# Patient Record
Sex: Female | Born: 1995 | Race: Black or African American | Hispanic: No | Marital: Single | Smoking: Never smoker
Health system: Southern US, Community
[De-identification: ages and names within clinical notes are randomized; demographics above are authoritative.]

---
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
## 2016-08-18 ENCOUNTER — Emergency Department (HOSPITAL_COMMUNITY)
Admission: EM | Admit: 2016-08-18 | Discharge: 2016-08-19 | Disposition: A | Discharge status: Home / Self Care | Payer: Self-pay | Attending: Emergency Medicine | Admitting: Emergency Medicine

## 2016-08-18 ENCOUNTER — Encounter (HOSPITAL_COMMUNITY): Payer: Self-pay | Admitting: Emergency Medicine

## 2016-08-18 DIAGNOSIS — R0789 Other chest pain: Secondary | ICD-10-CM | POA: Insufficient documentation

## 2016-08-18 NOTE — ED Provider Notes (Signed)
[facility]EMER[facility]Y DEPT Provider Note   CSN: [id number] Arrival date & time: 08/18/16  2233  By signing my name below, I, Majel HomerPeyton Lee, attest that this documentation has been prepared under the direction and in the presence of non-physician practitioner, Trixie DredgeEmily Gwendelyn Lanting, PA-C. Electronically Signed: Majel HomerPeyton Lee, Scribe. 08/18/2016. 11:56 PM.  History   Chief Complaint Chief Complaint  Patient presents with  . Shoulder Pain   The history is provided by the patient. No language interpreter was used.   HPI Comments: Brandy Porter is a 20 y.o. female who presents to the Emergency Department complaining of sudden onset, constant, left sided rib, chest and upper abdominal pain that began at ~7:00 PM this evening. Pt reports she was walking around the grocery store at the onset of her pain. She notes her pain is exacerbated when turning her body to the left; she states it is difficult to breathe during movement. Pt states associated weakness throughout her left arm that began at the same time as her rib pain. She denies recent fall or injury and any recent changes in activity. Pt also states she has a headache that began 30 minutes ago while in the ED. She denies fever, chills, sore throat, cough, nausea, vomiting, diarrhea, urinary frequency, dysuria, bowel incontinence, recent travel, leg swelling, vaginal bleeding and vaginal discharge. Pt reports her last normal menstrual period was 3 weeks ago. She denies hx and FHx of medical problems and blood clots.   Denies recent immobilization, exogenous estrogen, leg swelling, history of blood clots.     History reviewed. No pertinent past medical history.  There are no active problems to display for this patient.  History reviewed. No pertinent surgical history.  OB History    No data available     Home Medications    Prior to Admission medications   Medication Sig Start Date End Date Taking? Authorizing Provider  ibuprofen (ADVIL,MOTRIN) 600 MG tablet  Take 1 tablet (600 mg total) by mouth every 6 (six) hours as needed for mild pain or moderate pain. 08/19/16   Trixie DredgeEmily Kandra Graven, PA-C   Family History History reviewed. No pertinent family history.  Social History Social History  Substance Use Topics  . Smoking status: Never Smoker  . Smokeless tobacco: Never Used  . Alcohol use No   Allergies   Review of patient's allergies indicates no known allergies.  Review of Systems Review of Systems  Constitutional: Negative for chills and fever.  HENT: Negative for sore throat.   Respiratory: Positive for shortness of breath. Negative for cough.   Cardiovascular: Positive for chest pain. Negative for leg swelling.  Gastrointestinal: Positive for abdominal pain. Negative for diarrhea, nausea and vomiting.  Genitourinary: Negative for difficulty urinating, dysuria, vaginal bleeding and vaginal discharge.  Musculoskeletal: Positive for arthralgias and myalgias.  Neurological: Positive for weakness and headaches.   Physical Exam Updated Vital Signs BP 121/85 (BP Location: Left Arm)   Pulse 100   Temp 98.2 F (36.8 C) (Oral)   Resp 12   Ht 5\' 2"  (1.575 m)   Wt 127 lb 8 oz (57.8 kg)   LMP 08/04/2016 (Approximate)   SpO2 99%   BMI 23.32 kg/m   Physical Exam  Constitutional: She appears well-developed and well-nourished. No distress.  HENT:  Head: Normocephalic and atraumatic.  Neck: Normal range of motion. Neck supple.  Cardiovascular: Normal rate and regular rhythm.   Pulmonary/Chest: Effort normal and breath sounds normal. No respiratory distress. She has no wheezes. She has no rales.  She exhibits tenderness.  Abdominal: Soft. She exhibits no distension. There is no tenderness. There is no rebound and no guarding.  Musculoskeletal:  Upper extremities:  Strength 5/5 throughout, sensation intact, distal pulses intact.   Pain in left upper arm with shoulder abduction and posterior motion against resistance.  No cervical spine tenderness.      Neurological: She is alert.  No pronator drift   Skin: No rash noted. She is not diaphoretic. No erythema.  No skin changes of left lateral chest wall or abdomen  Nursing note and vitals reviewed.  ED Treatments / Results  Labs (all labs ordered are listed, but only abnormal results are displayed) Labs Reviewed - No data to display  EKG  EKG Interpretation None       ED ECG REPORT   Date: 08/19/2016  Rate: 93  Rhythm: normal sinus rhythm  QRS Axis: normal  Intervals: QT prolonged  ST/T Wave abnormalities: nonspecific T wave changes  Conduction Disutrbances:none  Narrative Interpretation:   Old EKG Reviewed: none available  I have personally reviewed the EKG tracing and agree with the computerized printout as noted. Radiology Dg Ribs Unilateral W/chest Left  Result Date: 08/19/2016 CLINICAL DATA:  General left-sided rib pain for 2 hours. Pain with inspiration. EXAM: LEFT RIBS AND CHEST - 3+ VIEW COMPARISON:  None. FINDINGS: No fracture or other bone lesions are seen involving the ribs. There is no pneumothorax or pleural effusion. Both lungs are clear. Heart size and mediastinal contours are within normal limits. IMPRESSION: No displaced rib fracture. Electronically Signed   By: Deatra RobinsonKevin  Herman M.D.   On: 08/19/2016 00:54    Procedures Procedures  DIAGNOSTIC STUDIES:  Oxygen Saturation is 99% on RA, normal by my interpretation.    COORDINATION OF CARE:  11:54 PM Discussed treatment plan with pt at bedside and pt agreed to plan.  Medications Ordered in ED Medications  ibuprofen (ADVIL,MOTRIN) tablet 600 mg (not administered)    Initial Impression / Assessment and Plan / ED Course  I have reviewed the triage vital signs and the nursing notes.  Pertinent labs & imaging results that were available during my care of the patient were reviewed by me and considered in my medical decision making (see chart for details).  Clinical Course  Comment By Time  Patient  currently without pain.  States she only has pain when she moves.   Trixie Dredgemily Crystian Frith, PA-C 08/21 0106    Afebrile, nontoxic patient with left lateral chest wall and left lateral abdominal wall pain and reported left arm weakness.  Denies injury.  There is no objective weakness on exam.  Pain is atypical - occurs only with movement/change positions, twisting to the left.  Exam is remarkable only for tenderness over left lateral chest wall, directly adjacent to the area of pain in the left arm with testing against resistance.  She has no risk factors for PE.  No cardiac risk factors.  No sick symptoms.  XR negative. EKG with slightly prolonged QT, nonspecific T wave changes.   Doubt ACS, PE, pneumonia.  C D/C home with motrin, PCP follow up.  Discussed result, findings, treatment, and follow up  with patient.  Pt given return precautions.  Pt verbalizes understanding and agrees with plan.       I personally performed the services described in this documentation, which was scribed in my presence. The recorded information has been reviewed and is accurate.   Final Clinical Impressions(s) / ED Diagnoses   Final diagnoses:  Chest wall pain    New Prescriptions New Prescriptions   IBUPROFEN (ADVIL,MOTRIN) 600 MG TABLET    Take 1 tablet (600 mg total) by mouth every 6 (six) hours as needed for mild pain or moderate pain.     Trixie Dredge, PA-C 08/19/16 0109    Shon Baton, MD 08/19/16 [phone]

## 2016-08-18 NOTE — ED Notes (Signed)
C/o pain in left rib cage that started a few hours ago.  Denies any injury.  Also reports left arm being weak.

## 2016-08-19 ENCOUNTER — Emergency Department (HOSPITAL_COMMUNITY): Payer: Self-pay

## 2016-08-19 ENCOUNTER — Other Ambulatory Visit: Payer: Self-pay

## 2016-08-19 MED ORDER — IBUPROFEN 600 MG PO TABS: 600.0000 mg | ORAL_TABLET | Freq: Four times a day (QID) | ORAL | 0 refills | Status: DC | PRN

## 2016-08-19 MED ORDER — IBUPROFEN 400 MG PO TABS
600.0000 mg | ORAL_TABLET | Freq: Once | ORAL | Status: AC
  Administered 2016-08-19: 600 mg via ORAL
  Filled 2016-08-19: qty 1

## 2016-08-19 NOTE — Discharge Instructions (Signed)
Read the information below.  Use the prescribed medication as directed.  Please discuss all new medications with your pharmacist.  You may return to the Emergency Department at any time for worsening condition or any new symptoms that concern you.   ° °You have been diagnosed by your caregiver as having chest wall pain. °SEEK IMMEDIATE MEDICAL ATTENTION IF: °You develop a fever.  °Your chest pains become severe or intolerable.  °You develop new, unexplained symptoms (problems).  °You develop shortness of breath, nausea, vomiting, sweating or feel light headed.  °You develop a new cough or you cough up blood. °

## 2016-08-19 NOTE — ED Notes (Signed)
Patient arrives with complaint of lateral thorax pain and left shoulder pain. Denies injury. States onset today while at grocery store. Raising arm anteriorly does not exacerbate pain, but posterior movement of arm exacerbates pain. ROM intact. Denies other complaints.

## 2017-06-24 ENCOUNTER — Emergency Department (HOSPITAL_COMMUNITY)
Admission: EM | Admit: 2017-06-24 | Discharge: 2017-06-24 | Disposition: A | Discharge status: Home / Self Care | Payer: Self-pay | Attending: Emergency Medicine | Admitting: Emergency Medicine

## 2017-06-24 ENCOUNTER — Encounter (HOSPITAL_COMMUNITY): Payer: Self-pay

## 2017-06-24 DIAGNOSIS — R300 Dysuria: Secondary | ICD-10-CM | POA: Insufficient documentation

## 2017-06-24 LAB — CBC
HCT: 39.2 % (ref 36.0–46.0)
Hemoglobin: 13.4 g/dL (ref 12.0–15.0)
MCH: 27.1 pg (ref 26.0–34.0)
MCHC: 34.2 g/dL (ref 30.0–36.0)
MCV: 79.4 fL (ref 78.0–100.0)
PLATELETS: 289 10*3/uL (ref 150–400)
RBC: 4.94 MIL/uL (ref 3.87–5.11)
RDW: 13.6 % (ref 11.5–15.5)
WBC: 7.9 10*3/uL (ref 4.0–10.5)

## 2017-06-24 LAB — COMPREHENSIVE METABOLIC PANEL
ALT: 14 U/L (ref 14–54)
AST: 21 U/L (ref 15–41)
Albumin: 4 g/dL (ref 3.5–5.0)
Alkaline Phosphatase: 73 U/L (ref 38–126)
Anion gap: 7 (ref 5–15)
BILIRUBIN TOTAL: 0.6 mg/dL (ref 0.3–1.2)
BUN: 8 mg/dL (ref 6–20)
CO2: 23 mmol/L (ref 22–32)
CREATININE: 0.65 mg/dL (ref 0.44–1.00)
Calcium: 8.9 mg/dL (ref 8.9–10.3)
Chloride: 106 mmol/L (ref 101–111)
GFR calc Af Amer: >60 mL/min (ref >60)
Glucose, Bld: 104 mg/dL — ABNORMAL HIGH (ref 65–99)
Potassium: 4.1 mmol/L (ref 3.5–5.1)
Sodium: 136 mmol/L (ref 135–145)
TOTAL PROTEIN: 7 g/dL (ref 6.5–8.1)

## 2017-06-24 LAB — URINALYSIS, ROUTINE W REFLEX MICROSCOPIC
Bilirubin Urine: NEGATIVE
GLUCOSE, UA: NEGATIVE mg/dL
Hgb urine dipstick: NEGATIVE
KETONES UR: NEGATIVE mg/dL
NITRITE: NEGATIVE
PH: 5 (ref 5.0–8.0)
Protein, ur: 30 mg/dL — AB
SPECIFIC GRAVITY, URINE: 1.014 (ref 1.005–1.030)

## 2017-06-24 LAB — I-STAT BETA HCG BLOOD, ED (MC, WL, AP ONLY): I-stat hCG, quantitative: <5 m[IU]/mL (ref <5)

## 2017-06-24 MED ORDER — CEPHALEXIN 500 MG PO CAPS: 500.0000 mg | ORAL_CAPSULE | Freq: Two times a day (BID) | ORAL | 0 refills | Status: DC

## 2017-06-24 NOTE — ED Triage Notes (Signed)
Pt states pain in the lower part of her abdomen with movement or with urination. Pt denies pain at this time. Pt denies n/v.

## 2017-06-24 NOTE — ED Provider Notes (Signed)
[facility] Provider Note   CSN: [id number] Arrival date & time: 06/24/17  1217  By signing my name below, I, Sonum Patel, attest that this documentation has been prepared under the direction and in the presence of Wells FargoKelly Rickey Sadowski, PA-C. Electronically Signed: Leone PayorSonum Patel, Scribe. 06/24/17. 2:14 PM.  History   Chief Complaint Chief Complaint  Patient presents with  . Abdominal Pain    The history is provided by the patient. No language interpreter was used.     HPI Comments: Brandy Porter is a 21 y.o. female who presents to the Emergency Department complaining of dysuria with associated lower abdominal pain and frequency that began last night. She denies similar symptoms in the past. She denies nausea, vomiting, pain with BM, fever, flank pain, vaginal bleeding, vaginal discharge. LNMP was May 20th. She has never been sexually active and does not use birth control.   History reviewed. No pertinent past medical history.  There are no active problems to display for this patient.   History reviewed. No pertinent surgical history.  OB History    No data available       Home Medications    Prior to Admission medications   Medication Sig Start Date End Date Taking? Authorizing Provider  ibuprofen (ADVIL,MOTRIN) 600 MG tablet Take 1 tablet (600 mg total) by mouth every 6 (six) hours as needed for mild pain or moderate pain. 08/19/16   Trixie DredgeWest, Emily, PA-C    Family History History reviewed. No pertinent family history.  Social History Social History  Substance Use Topics  . Smoking status: Never Smoker  . Smokeless tobacco: Never Used  . Alcohol use No     Allergies   Patient has no known allergies.   Review of Systems Review of Systems  Constitutional: Negative for fever.  Gastrointestinal: Positive for abdominal pain. Negative for nausea and vomiting.  Genitourinary: Positive for dysuria and frequency. Negative for flank pain, hematuria, urgency, vaginal  bleeding and vaginal discharge.  All other systems reviewed and are negative.    Physical Exam Updated Vital Signs BP 117/71 (BP Location: Left Arm)   Pulse 88   Temp 97.8 F (36.6 C) (Oral)   Resp 16   LMP 05/18/2017 (Within Days)   SpO2 100%   Physical Exam  Constitutional: She is oriented to person, place, and time. She appears well-developed and well-nourished. No distress.  HENT:  Head: Normocephalic and atraumatic.  Eyes: Conjunctivae are normal. Pupils are equal, round, and reactive to light. Right eye exhibits no discharge. Left eye exhibits no discharge. No scleral icterus.  Neck: Normal range of motion.  Cardiovascular: Normal rate and regular rhythm.  Exam reveals no gallop and no friction rub.   No murmur heard. Pulmonary/Chest: Effort normal and breath sounds normal. No respiratory distress. She has no wheezes. She has no rales. She exhibits no tenderness.  Abdominal: Soft. Bowel sounds are normal. She exhibits no distension and no mass. There is no tenderness. There is no rebound and no guarding. No hernia.  No CVA tenderness  Neurological: She is alert and oriented to person, place, and time.  Skin: Skin is warm and dry.  Psychiatric: She has a normal mood and affect. Her behavior is normal.  Nursing note and vitals reviewed.    ED Treatments / Results  DIAGNOSTIC STUDIES: Oxygen Saturation is 100% on RA, normal by my interpretation.    COORDINATION OF CARE: 2:14 PM Discussed treatment plan with pt at bedside and pt agreed to plan.  Labs (all labs ordered are listed, but only abnormal results are displayed) Labs Reviewed  COMPREHENSIVE METABOLIC PANEL - Abnormal; Notable for the following:       Result Value   Glucose, Bld 104 (*)    All other components within normal limits  URINALYSIS, ROUTINE W REFLEX MICROSCOPIC - Abnormal; Notable for the following:    APPearance HAZY (*)    Protein, ur 30 (*)    Leukocytes, UA TRACE (*)    Bacteria, UA RARE (*)     Squamous Epithelial / LPF 0-5 (*)    All other components within normal limits  URINE CULTURE  CBC  I-STAT BETA HCG BLOOD, ED ([facility], [facility], [facility] ONLY)    EKG  EKG Interpretation None       Radiology No results found.  Procedures Procedures (including critical care time)  Medications Ordered in ED Medications - No data to display   Initial Impression / Assessment and Plan / ED Course  I have reviewed the triage vital signs and the nursing notes.  Pertinent labs & imaging results that were available during my care of the patient were reviewed by me and considered in my medical decision making (see chart for details).  21 year old female presents with dysuria and frequency. Vitals are normal. Labs are overall unremarkable. UA does not show obvious UTI but will treat since she is symptomatic and send culture. Return precautions given.  Final Clinical Impressions(s) / ED Diagnoses   Final diagnoses:  Dysuria    New Prescriptions New Prescriptions   No medications on file   I personally performed the services described in this documentation, which was scribed in my presence. The recorded information has been reviewed and is accurate.    Bethel Born, PA-C 06/24/17 1541    Alvira Monday, MD 06/25/17 [phone]

## 2017-06-24 NOTE — Discharge Instructions (Signed)
Take antibiotic twice a day for 5 days Return for worsening symptoms

## 2017-06-25 LAB — URINE CULTURE

## 2017-07-23 IMAGING — DX DG RIBS W/ CHEST 3+V*L*
3 series · 3 of 3 positions shown · non-contrast
Comparison: None.

CLINICAL DATA: General left-sided rib pain for 2 hours. Pain with
inspiration.

EXAM:
LEFT RIBS AND CHEST - 3+ VIEW

[chest pa]
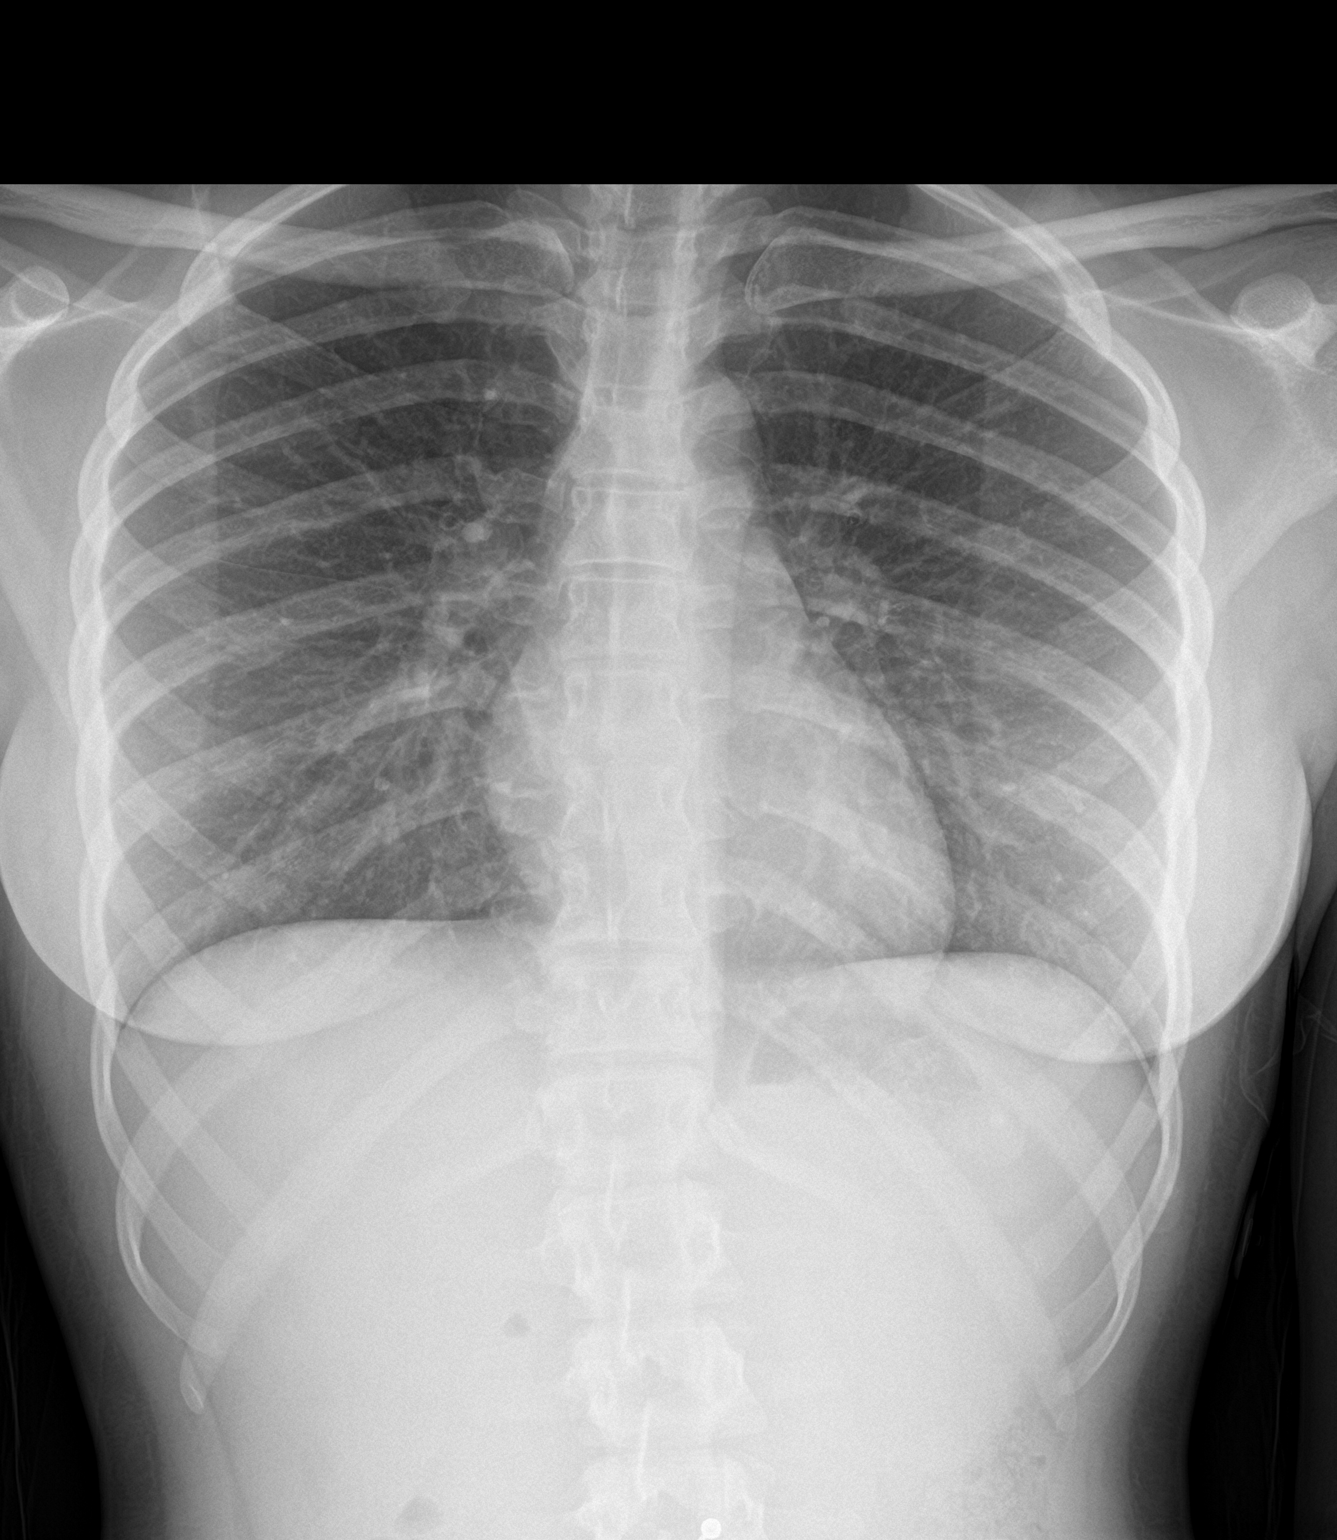

[rib pa obl]
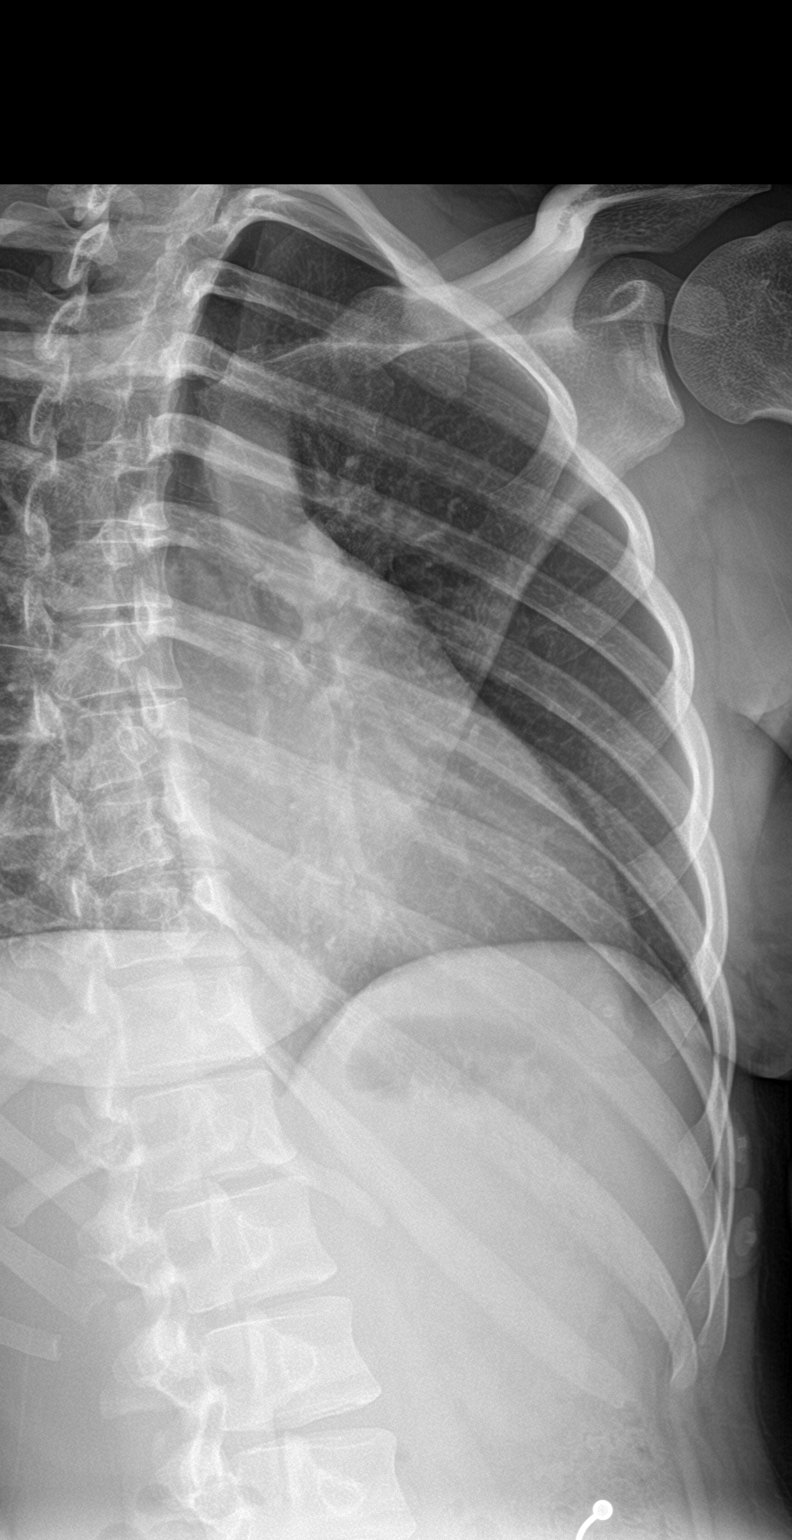

[rib pa]
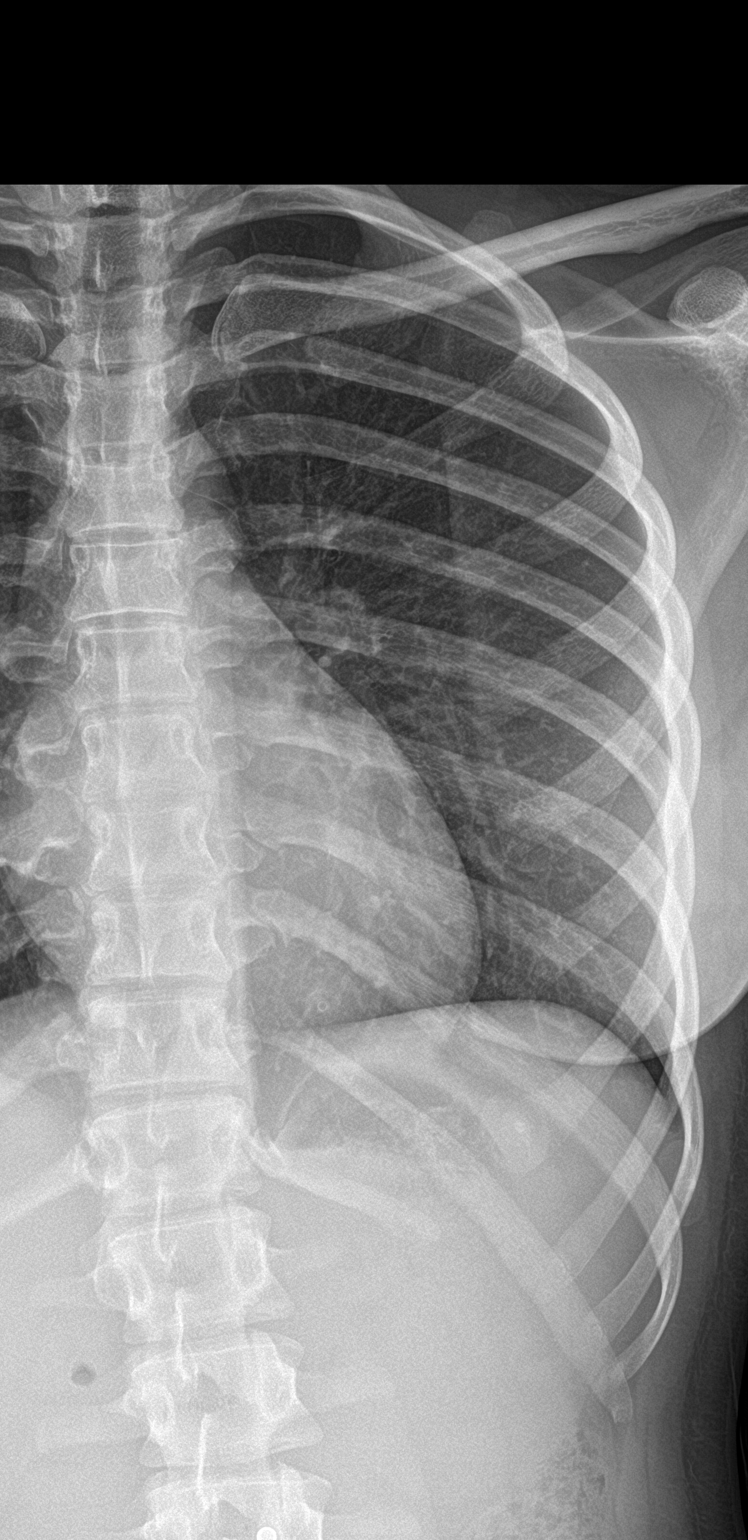

[3 of 3 positions shown; findings below may reference images not displayed]

FINDINGS: No fracture or other bone lesions are seen involving the ribs. There
is no pneumothorax or pleural effusion. Both lungs are clear. Heart
size and mediastinal contours are within normal limits.
IMPRESSION: No displaced rib fracture.

## 2020-03-28 ENCOUNTER — Ambulatory Visit: Payer: Self-pay | Admitting: Family Medicine

## 2020-03-28 ENCOUNTER — Encounter: Payer: Self-pay | Admitting: Family Medicine

## 2020-03-28 VITALS — BP 118/76 | HR 82 | Wt 189.0 lb

## 2020-03-28 DIAGNOSIS — Z789 Other specified health status: Secondary | ICD-10-CM

## 2020-03-28 NOTE — Progress Notes (Signed)
  Subjective:     Patient ID: Brandy Porter, female   DOB: 02-Jul-1996, 24 y.o.   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 She does not currently have a PCP. She has never had a pap smear. She is sexually active but not currently, uses condoms. No other form of birth control. Periods are regular. She has not received flu or covid-19 vaccines. She is a non-smoker. States she needs to work on increasing her exercise. She denies any current problems or concerns.   No past medical history on file. No Known Allergies No current outpatient medications on file.    Review of Systems  Constitutional: Negative for chills, fatigue, fever and unexpected weight change.  HENT: Negative for congestion, ear pain, sinus pressure, sinus pain and sore throat.   Eyes: Negative for discharge and visual disturbance.  Respiratory: Negative for cough, shortness of breath and wheezing.   Cardiovascular: Negative for chest pain and leg swelling.  Gastrointestinal: Negative for abdominal pain, blood in stool, constipation, diarrhea, nausea and vomiting.  Genitourinary: Negative for difficulty urinating and hematuria.  Skin: Negative for color change.  Neurological: Negative for dizziness, weakness, light-headedness and headaches.  Hematological: Negative for adenopathy.  All other systems reviewed and are negative.      Objective:   Physical Exam Vitals reviewed.  Constitutional:      General: She is not in acute distress.    Appearance: Normal appearance. She is well-developed.  HENT:     Head: Normocephalic and atraumatic.  Eyes:     General:        Right eye: No discharge.        Left eye: No discharge.  Cardiovascular:     Rate and Rhythm: Normal rate and regular rhythm.     Heart sounds: Normal heart sounds.  Pulmonary:     Effort: Pulmonary effort is normal. No respiratory distress.     Breath sounds: Normal  breath sounds.  Musculoskeletal:     Cervical back: Neck supple.  Skin:    General: Skin is warm and dry.  Neurological:     Mental Status: She is alert and oriented to person, place, and time.  Psychiatric:        Mood and Affect: Mood normal.        Behavior: Behavior normal.    Today's Vitals   03/28/20 1435  BP: 118/76  Pulse: 82  SpO2: 98%  Weight: 189 lb (85.7 kg)   Body mass index is 34.57 kg/m.      Assessment:     Participant in health and wellness plan      Plan:     1. Encouraged establishment with PCP and to have pap smear done. Discussed HPV vaccination if she has not already received it, she was unsure of her vaccination status. Information given on [facility] physician referral service phone number to help with getting access to PCP.  2. Encouraged healthy eating and increasing physical activity. 3. F/u here prn.

## 2021-09-11 ENCOUNTER — Ambulatory Visit: Payer: Self-pay | Admitting: Family Medicine

## 2021-09-11 ENCOUNTER — Encounter: Payer: Self-pay | Admitting: Family Medicine

## 2021-09-11 VITALS — BP 102/64 | HR 85

## 2021-09-11 DIAGNOSIS — Z789 Other specified health status: Secondary | ICD-10-CM

## 2021-09-11 NOTE — Progress Notes (Signed)
  Subjective:     Patient ID: Brandy Porter, female   DOB: July 18, 1996, 25 y.o.   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 She has an appt tomorrow to establish care with a provider at [facility], where she will have a pap smear completed. She reports doing well with healthy eating and exercise. She denies any problems or concerns at today's visit.   No past medical history on file. No Known Allergies No current outpatient medications on file.   Review of Systems  Constitutional:  Negative for chills, fatigue, fever and unexpected weight change.  HENT:  Negative for congestion, ear pain, sinus pressure, sinus pain and sore throat.   Eyes:  Negative for discharge and visual disturbance.  Respiratory:  Negative for cough, shortness of breath and wheezing.   Cardiovascular:  Negative for chest pain and leg swelling.  Gastrointestinal:  Negative for abdominal pain, blood in stool, constipation, diarrhea, nausea and vomiting.  Genitourinary:  Negative for difficulty urinating and hematuria.  Skin:  Negative for color change.  Neurological:  Negative for dizziness, weakness, light-headedness and headaches.  Hematological:  Negative for adenopathy.  All other systems reviewed and are negative.     Objective:   Physical Exam Vitals reviewed.  Constitutional:      General: She is not in acute distress.    Appearance: Normal appearance. She is well-developed.  HENT:     Head: Normocephalic and atraumatic.  Eyes:     General:        Right eye: No discharge.        Left eye: No discharge.  Cardiovascular:     Rate and Rhythm: Normal rate and regular rhythm.     Heart sounds: Normal heart sounds.  Pulmonary:     Effort: Pulmonary effort is normal. No respiratory distress.     Breath sounds: Normal breath sounds.  Musculoskeletal:     Cervical back: Neck supple.  Skin:    General: Skin is warm and  dry.  Neurological:     Mental Status: She is alert and oriented to person, place, and time.  Psychiatric:        Mood and Affect: Mood normal.        Behavior: Behavior normal.   Today's Vitals   09/11/21 0943  BP: 102/64  Pulse: 85  SpO2: 99%   There is no height or weight on file to calculate BMI.     Assessment:     Participant in health and wellness plan     Plan:     Keep all regular appts with PCP. Continue efforts at healthy eating and exercise. F/u here prn.

## 2022-08-13 ENCOUNTER — Encounter: Payer: Self-pay | Admitting: Nurse Practitioner

## 2022-08-13 ENCOUNTER — Ambulatory Visit: Payer: Self-pay | Admitting: Nurse Practitioner

## 2022-08-13 VITALS — BP 118/80 | HR 86

## 2022-08-13 DIAGNOSIS — Z789 Other specified health status: Secondary | ICD-10-CM

## 2022-08-13 NOTE — Progress Notes (Signed)
Office Visit.   Subjective:  Patient ID: Brandy Porter, female    DOB: 31-Oct-1996  Age: 26 y.o. MRN: [id number]  CC: wellness exam  HPI Brandy Porter presents for wellness exam visit for insurance benefit.  Patient has a PCP: [facility], Dr. Lorane Gell.  PMH significant for: No significant PMH. Last labs per PCP were completed: September 2022  Health Maintenance:  Pap: September 2022 Colonoscopy: No family history.  Mammogram: no significant family history.     Smoker: never Occasional alcohol use (social drinker)  Immunizations:  COVID- x 2, declines booster Does not usually get flu vaccine. Tdap- unsure of last dose. She will defer for now.   Lifestyle: Diet- tries to avoid snacking.  Exercise- sometimes. Not regularly.    No past medical history on file.  No past surgical history on file.  No outpatient medications prior to visit.   No facility-administered medications prior to visit.    ROS Review of Systems  Respiratory:  Negative for shortness of breath.   Cardiovascular:  Negative for chest pain.  Gastrointestinal:  Negative for constipation and diarrhea.  Neurological:  Positive for headaches (sometimes. hx of migraines. improved).    Objective:  BP 118/80   Pulse 86   SpO2 99%   Physical Exam Constitutional:      General: She is not in acute distress. HENT:     Head: Normocephalic.  Cardiovascular:     Rate and Rhythm: Normal rate and regular rhythm.     Heart sounds: Normal heart sounds.  Pulmonary:     Effort: Pulmonary effort is normal.     Breath sounds: Normal breath sounds.  Musculoskeletal:        General: Normal range of motion.     Right lower leg: No edema.     Left lower leg: No edema.  Skin:    General: Skin is warm.  Neurological:     General: No focal deficit present.     Mental Status: She is alert and oriented to person, place, and time.  Psychiatric:        Mood and Affect: Mood normal.        Behavior:  Behavior normal.      Assessment & Plan:    Peter was seen today for wellness exam.  Diagnoses and all orders for this visit:  Participant in health and wellness plan Adult wellness physical was conducted today. Importance of diet and exercise were discussed in detail.  We reviewed immunizations and gave recommendations regarding current immunization needed for age. Patient will let me know if she would like immunizations updated. Preventative health exams up to date. Follow-up with PCP as recommended.   Patient was advised yearly wellness exam    No orders of the defined types were placed in this encounter.   No orders of the defined types were placed in this encounter.   Follow-up: As needed.

## 2023-04-05 ENCOUNTER — Encounter (HOSPITAL_COMMUNITY): Payer: Self-pay | Admitting: Physician Assistant

## 2023-04-05 ENCOUNTER — Ambulatory Visit (HOSPITAL_COMMUNITY)
Admission: EM | Admit: 2023-04-05 | Discharge: 2023-04-05 | Disposition: A | Discharge status: Home / Self Care | Payer: Self-pay | Attending: Physician Assistant | Admitting: Physician Assistant

## 2023-04-05 DIAGNOSIS — R1010 Upper abdominal pain, unspecified: Secondary | ICD-10-CM | POA: Insufficient documentation

## 2023-04-05 LAB — CBC WITH DIFFERENTIAL/PLATELET
Abs Immature Granulocytes: 0.02 10*3/uL (ref 0.00–0.07)
Basophils Absolute: 0 10*3/uL (ref 0.0–0.1)
Basophils Relative: 1 %
Eosinophils Absolute: 0.2 10*3/uL (ref 0.0–0.5)
Eosinophils Relative: 3 %
HCT: 35.3 % — ABNORMAL LOW (ref 36.0–46.0)
Hemoglobin: 11.3 g/dL — ABNORMAL LOW (ref 12.0–15.0)
Immature Granulocytes: 0 %
Lymphocytes Relative: 29 %
Lymphs Abs: 2.4 10*3/uL (ref 0.7–4.0)
MCH: 24.6 pg — ABNORMAL LOW (ref 26.0–34.0)
MCHC: 32 g/dL (ref 30.0–36.0)
MCV: 76.7 fL — ABNORMAL LOW (ref 80.0–100.0)
Monocytes Absolute: 0.6 10*3/uL (ref 0.1–1.0)
Monocytes Relative: 7 %
Neutro Abs: 5 10*3/uL (ref 1.7–7.7)
Neutrophils Relative %: 60 %
Platelets: 351 10*3/uL (ref 150–400)
RBC: 4.6 MIL/uL (ref 3.87–5.11)
RDW: 15.2 % (ref 11.5–15.5)
WBC: 8.3 10*3/uL (ref 4.0–10.5)
nRBC: 0 % (ref 0.0–0.2)

## 2023-04-05 LAB — POCT URINALYSIS DIPSTICK, ED / UC
Bilirubin Urine: NEGATIVE
Glucose, UA: NEGATIVE mg/dL
Ketones, ur: NEGATIVE mg/dL
Nitrite: NEGATIVE
Protein, ur: NEGATIVE mg/dL
Specific Gravity, Urine: 1.02 (ref 1.005–1.030)
Urobilinogen, UA: 0.2 mg/dL (ref 0.0–1.0)
pH: 6.5 (ref 5.0–8.0)

## 2023-04-05 LAB — COMPREHENSIVE METABOLIC PANEL
ALT: 12 U/L (ref 0–44)
AST: 14 U/L — ABNORMAL LOW (ref 15–41)
Albumin: 3.7 g/dL (ref 3.5–5.0)
Alkaline Phosphatase: 62 U/L (ref 38–126)
Anion gap: 9 (ref 5–15)
BUN: 7 mg/dL (ref 6–20)
CO2: 23 mmol/L (ref 22–32)
Calcium: 8.8 mg/dL — ABNORMAL LOW (ref 8.9–10.3)
Chloride: 104 mmol/L (ref 98–111)
Creatinine, Ser: 0.83 mg/dL (ref 0.44–1.00)
GFR, Estimated: >60 mL/min (ref >60)
Glucose, Bld: 112 mg/dL — ABNORMAL HIGH (ref 70–99)
Potassium: 4 mmol/L (ref 3.5–5.1)
Sodium: 136 mmol/L (ref 135–145)
Total Bilirubin: 0.3 mg/dL (ref 0.3–1.2)
Total Protein: 7 g/dL (ref 6.5–8.1)

## 2023-04-05 LAB — POC URINE PREG, ED: Preg Test, Ur: NEGATIVE

## 2023-04-05 LAB — LIPASE, BLOOD: Lipase: 42 U/L (ref 11–51)

## 2023-04-05 MED ORDER — ALUM & MAG HYDROXIDE-SIMETH 200-200-20 MG/5ML PO SUSP
30.0000 mL | Freq: Once | ORAL | Status: AC
  Administered 2023-04-05: 30 mL via ORAL

## 2023-04-05 MED ORDER — ALUM & MAG HYDROXIDE-SIMETH 200-200-20 MG/5ML PO SUSP
ORAL | Status: AC
  Filled 2023-04-05: qty 30

## 2023-04-05 NOTE — Discharge Instructions (Addendum)
I am sorry that the medication does not help your stomach.  Eat a bland diet and drink plenty of fluid.  Do not take any NSAIDs (aspirin, ibuprofen/Advil, naproxen/Aleve) and avoid alcohol.  I will contact you if your lab work is abnormal.  Assuming that your symptoms or not worsening please call and schedule an appointment with gastroenterology first thing Monday.  If your symptoms are not improving or if anything changes go to the emergency room immediately including severe abdominal pain, nausea, vomiting, trouble having a bowel movement, blood in your stool.

## 2023-04-05 NOTE — ED Triage Notes (Signed)
Pt is here for abdominal pain , upper back pain it started on 03/27/2023 and went away . Pain came back yesterday as per pt

## 2023-04-05 NOTE — ED Provider Notes (Signed)
[facility]    CSN: [id number] Arrival date & time: 04/05/23  1621      History   Chief Complaint Chief Complaint  Patient presents with   Abdominal Pain   Back Pain    HPI Brandy Porter is a 27 y.o. female.   Patient presents today with a 2-day history of upper abdominal pain.  She reports that pain wraps around to her back.  It is currently rated 8 on a 0-10 pain scale, described as aching, no aggravating alleviating factors notified.  She reports over the past 24 hours the symptoms worsened prompting evaluation but then has been stable and persistent since that time.  She denies any associated nausea, vomiting, diarrhea.  Reports she has had a normal bowel movement within the past 24 hours without blood or mucus.  She denies any chest pain, shortness of breath.  Denies history of gastrointestinal disorder including GERD or peptic ulcer disease.  She has not noticed any association with eating.  She has not tried any over-the-counter medication for symptom management.  She does not take NSAIDs on a regular basis.  Denies history of diabetes or GLP-1 agonist use.  Denies any increased alcohol consumption.    History reviewed. No pertinent past medical history.  There are no problems to display for this patient.   History reviewed. No pertinent surgical history.  OB History   No obstetric history on file.      Home Medications    Prior to Admission medications   Not on File    Family History History reviewed. No pertinent family history.  Social History Social History   Tobacco Use   Smoking status: Never   Smokeless tobacco: Never  Substance Use Topics   Alcohol use: No   Drug use: No     Allergies   Patient has no known allergies.   Review of Systems Review of Systems  Constitutional:  Negative for activity change, appetite change, fatigue and fever.  Respiratory:  Negative for cough and shortness of breath.   Cardiovascular:  Negative  for chest pain.  Gastrointestinal:  Positive for abdominal pain. Negative for blood in stool, constipation, diarrhea, nausea and vomiting.  Genitourinary:  Negative for dysuria, frequency, hematuria, urgency, vaginal bleeding, vaginal discharge and vaginal pain.     Physical Exam Triage Vital Signs ED Triage Vitals  Enc Vitals Group     BP 04/05/23 1657 125/77     Pulse Rate 04/05/23 1657 69     Resp 04/05/23 1657 12     Temp 04/05/23 1657 98.4 F (36.9 C)     Temp Source 04/05/23 1657 Oral     SpO2 04/05/23 1657 97 %     Weight --      Height --      Head Circumference --      Peak Flow --      Pain Score 04/05/23 1654 8     Pain Loc --      Pain Edu? --      Excl. in GC? --    No data found.  Updated Vital Signs BP 125/77 (BP Location: Right Arm)   Pulse 69   Temp 98.4 F (36.9 C) (Oral)   Resp 12   LMP 03/20/2023   SpO2 97%   Visual Acuity Right Eye Distance:   Left Eye Distance:   Bilateral Distance:    Right Eye Near:   Left Eye Near:    Bilateral Near:  Physical Exam Vitals reviewed.  Constitutional:      General: She is awake. She is not in acute distress.    Appearance: Normal appearance. She is well-developed. She is not ill-appearing.     Comments: Very pleasant female appears stated age in no acute distress sitting comfortably in exam room  HENT:     Head: Normocephalic and atraumatic.  Cardiovascular:     Rate and Rhythm: Normal rate and regular rhythm.     Heart sounds: Normal heart sounds, S1 normal and S2 normal. No murmur heard. Pulmonary:     Effort: Pulmonary effort is normal.     Breath sounds: Normal breath sounds. No wheezing, rhonchi or rales.     Comments: Clear to auscultation bilaterally Abdominal:     General: Bowel sounds are normal.     Palpations: Abdomen is soft.     Tenderness: There is abdominal tenderness in the epigastric area and left upper quadrant. There is no right CVA tenderness, left CVA tenderness, guarding or  rebound. Negative signs include Murphy's sign, Rovsing's sign, McBurney's sign and psoas sign.     Comments: No evidence of acute abdomen on physical exam  Psychiatric:        Behavior: Behavior is cooperative.      UC Treatments / Results  Labs (all labs ordered are listed, but only abnormal results are displayed) Labs Reviewed  POCT URINALYSIS DIPSTICK, ED / UC - Abnormal; Notable for the following components:      Result Value   Hgb urine dipstick TRACE (*)    Leukocytes,Ua TRACE (*)    All other components within normal limits  URINE CULTURE  COMPREHENSIVE METABOLIC PANEL  CBC WITH DIFFERENTIAL/PLATELET  LIPASE, BLOOD  POC URINE PREG, ED    EKG   Radiology No results found.  Procedures Procedures (including critical care time)  Medications Ordered in UC Medications  alum & mag hydroxide-simeth (MAALOX/MYLANTA) 200-200-20 MG/5ML suspension 30 mL (30 mLs Oral Given 04/05/23 1718)    Initial Impression / Assessment and Plan / UC Course  I have reviewed the triage vital signs and the nursing notes.  Pertinent labs & imaging results that were available during my care of the patient were reviewed by me and considered in my medical decision making (see chart for details).     Patient is well-appearing, afebrile, nontoxic, nontachycardic.  Vital signs and physical exam are reassuring with no indication for emergent evaluation or imaging.  Urine pregnancy was negative.  UA did show some trace blood and leukocytes but she denies any dysuria.  Will send for culture but defer antibiotics until culture results are available.  Basic labs including CBC, CMP, lipase.  We discussed that if these are abnormal she would need to go to the emergency room.  She was given a GI cocktail in clinic without improvement of symptoms.  She is to avoid NSAIDs and alcohol.  We did discuss potential utility of going to the ER now given her abdominal pain since we do not have imaging capabilities in  urgent care but she declined to do this.  Will have her follow-up with gastroenterology first thing next week.  She was given contact information for local provider with instruction to call to schedule an appointment.  We discussed that she should have a low suspicion for going to the emergency room including persistent abdominal pain, worsening abdominal pain, fever, nausea, vomiting, melena, hematochezia, weakness.  Strict return precautions given.  Work excuse note provided. Final Clinical Impressions(s) /  UC Diagnoses   Final diagnoses:  Upper abdominal pain     Discharge Instructions      I am sorry that the medication does not help your stomach.  Eat a bland diet and drink plenty of fluid.  Do not take any NSAIDs (aspirin, ibuprofen/Advil, naproxen/Aleve) and avoid alcohol.  I will contact you if your lab work is abnormal.  Assuming that your symptoms or not worsening please call and schedule an appointment with gastroenterology first thing Monday.  If your symptoms are not improving or if anything changes go to the emergency room immediately including severe abdominal pain, nausea, vomiting, trouble having a bowel movement, blood in your stool.     ED Prescriptions   None    PDMP not reviewed this encounter.   Jeani Hawking, PA-C 04/05/23 1743

## 2023-04-06 LAB — URINE CULTURE: Culture: <10000 — AB

## 2023-09-09 ENCOUNTER — Encounter: Payer: Self-pay | Admitting: Nurse Practitioner

## 2023-09-09 ENCOUNTER — Ambulatory Visit: Payer: Self-pay | Admitting: Nurse Practitioner

## 2023-09-09 VITALS — BP 112/78 | HR 92 | Ht 63.0 in | Wt 163.0 lb

## 2023-09-09 DIAGNOSIS — Z789 Other specified health status: Secondary | ICD-10-CM

## 2023-09-09 DIAGNOSIS — Z23 Encounter for immunization: Secondary | ICD-10-CM

## 2023-09-09 NOTE — Progress Notes (Signed)
[facility]  Subjective:  Patient ID: Brandy Porter, female    DOB: 1996-12-20  Age: 27 y.o. MRN: [id number]  CC: wellness exam   HPI AVANELLE RITCH presents for wellness exam visit for insurance benefit.  Patient has a PCP: [facility], Dr. Lorane Gell PMH significant for: none Last labs per PCP were completed: April 2024  Health Maintenance:  Colonoscopy: no family hx Mammogram: no family hx Pap: up to date     Smoker: never  Immunizations:  COVID- x2 Flu- declines Tdap- would like to update.  Lifestyle: Diet- no particular diet Exercise- stays pretty active at work     No past medical history on file.  No past surgical history on file.  No outpatient medications prior to visit.   No facility-administered medications prior to visit.    ROS Review of Systems  Constitutional:  Negative for fatigue.  Respiratory:  Negative for shortness of breath.   Cardiovascular:  Negative for chest pain and leg swelling.  Gastrointestinal:  Negative for constipation, diarrhea, nausea and vomiting.  Musculoskeletal: Negative.   Neurological:  Negative for dizziness and headaches.  Psychiatric/Behavioral:  Negative for sleep disturbance.     Objective:  BP 112/78   Pulse 92   Ht 5\' 3"  (1.6 m)   Wt 163 lb (73.9 kg)   SpO2 98%   BMI 28.87 kg/m   Physical Exam Constitutional:      General: She is not in acute distress. HENT:     Head: Normocephalic.     Mouth/Throat:     Mouth: Mucous membranes are moist.     Pharynx: Oropharynx is clear.  Eyes:     Pupils: Pupils are equal, round, and reactive to light.  Cardiovascular:     Rate and Rhythm: Normal rate and regular rhythm.     Heart sounds: Normal heart sounds.  Pulmonary:     Effort: Pulmonary effort is normal.     Breath sounds: Normal breath sounds.  Abdominal:     General: Bowel sounds are normal.     Palpations: Abdomen is soft.  Musculoskeletal:        General: Normal range  of motion.     Cervical back: Normal range of motion.     Right lower leg: No edema.     Left lower leg: No edema.  Skin:    General: Skin is warm.  Neurological:     General: No focal deficit present.     Mental Status: She is alert and oriented to person, place, and time.  Psychiatric:        Mood and Affect: Mood normal.        Behavior: Behavior normal.      Assessment & Plan:    Rosy was seen today for wellness exam.  Diagnoses and all orders for this visit:  Participant in health and wellness plan Adult wellness physical was conducted today. Importance of diet and exercise were discussed in detail.  We reviewed immunizations and gave recommendations regarding current immunization needed for age. Will update Tdap today.  Preventative health exams are up to date   Patient was advised yearly wellness exam  Immunization due Tdap administered in right deltoid today. Well tolerated.    No orders of the defined types were placed in this encounter.   No orders of the defined types were placed in this encounter.   Follow-up: as needed.

## 2024-04-06 ENCOUNTER — Ambulatory Visit: Payer: Self-pay | Admitting: Nurse Practitioner

## 2024-04-06 NOTE — Progress Notes (Deleted)
 [facility]  Subjective:  Patient ID: Brandy Porter, female    DOB: 23-Dec-1996  Age: 28 y.o. MRN: [id number]  CC: No chief complaint on file.   HPI Brandy Porter presents for wellness exam visit for insurance benefit.  Patient has a PCP: PMH significant for: non  Last labs per PCP were completed   Health Maintenance:  Colonoscopy: no family hx Mammogram: no family hx Pap:     Smoker:  Immunizations:  Shingrix-   Flu- COVID-    Lifestyle: Diet- Exercise-     No past medical history on file.  No past surgical history on file.  No outpatient medications prior to visit.   No facility-administered medications prior to visit.    ROS Review of Systems  Objective:  There were no vitals taken for this visit.  Physical Exam   Assessment & Plan:    Diagnoses and all orders for this visit:  Participant in health and wellness plan    No orders of the defined types were placed in this encounter.   No orders of the defined types were placed in this encounter.   Follow-up: No follow-ups on file.

## 2024-04-13 ENCOUNTER — Ambulatory Visit: Payer: Self-pay | Admitting: Nurse Practitioner

## 2024-04-13 NOTE — Progress Notes (Deleted)
 [facility]  Subjective:  Patient ID: Brandy Porter, female    DOB: 23-Dec-1996  Age: 28 y.o. MRN: [id number]  CC: No chief complaint on file.   HPI Brandy Porter presents for wellness exam visit for insurance benefit.  Patient has a PCP: PMH significant for: non  Last labs per PCP were completed   Health Maintenance:  Colonoscopy: no family hx Mammogram: no family hx Pap:     Smoker:  Immunizations:  Shingrix-   Flu- COVID-    Lifestyle: Diet- Exercise-     No past medical history on file.  No past surgical history on file.  No outpatient medications prior to visit.   No facility-administered medications prior to visit.    ROS Review of Systems  Objective:  There were no vitals taken for this visit.  Physical Exam   Assessment & Plan:    Diagnoses and all orders for this visit:  Participant in health and wellness plan    No orders of the defined types were placed in this encounter.   No orders of the defined types were placed in this encounter.   Follow-up: No follow-ups on file.

## 2024-04-20 ENCOUNTER — Encounter: Payer: Self-pay | Admitting: Nurse Practitioner

## 2024-04-20 ENCOUNTER — Ambulatory Visit: Payer: Self-pay | Admitting: Nurse Practitioner

## 2024-04-20 VITALS — BP 110/68 | HR 87 | Temp 97.5°F

## 2024-04-20 DIAGNOSIS — Z789 Other specified health status: Secondary | ICD-10-CM

## 2024-04-20 NOTE — Progress Notes (Signed)
 [facility]  Subjective:  Patient ID: Brandy Porter, female    DOB: March 28, 1996  Age: 28 y.o. MRN: [id number]  CC: Wellness exam   HPI Brandy Porter presents for wellness exam visit for insurance benefit.  Patient has a PCP: [facility] at [facility] PMH significant for: none Last labs per PCP were completed: Yesterday   Health Maintenance:  Colonoscopy: no family hx Mammogram: no family hx Pap: Yesterday, April 2025. On nexplanon for birth control.     Smoker: never   Immunizations:  Flu- declines COVID-  Tdap- sept 2024   Lifestyle: Diet- no particular diet  Exercise- nonr     No past medical history on file.  No past surgical history on file.  No outpatient medications prior to visit.   No facility-administered medications prior to visit.    ROS Review of Systems  HENT:  Negative for hearing loss.   Eyes:  Negative for visual disturbance.  Respiratory:  Negative for shortness of breath.   Cardiovascular:  Negative for chest pain.  Gastrointestinal:  Negative for constipation, diarrhea, nausea and vomiting.  Musculoskeletal: Negative.   Neurological:  Negative for dizziness and headaches.  Psychiatric/Behavioral:  Negative for sleep disturbance.     Objective:  BP 110/68 (BP Location: Left Arm, Patient Position: Sitting, Cuff Size: Normal)   Pulse 87   Temp (!) 97.5 F (36.4 C) (Temporal)   LMP 03/17/2024 (Approximate)   SpO2 98%   Physical Exam Constitutional:      General: She is not in acute distress. HENT:     Head: Normocephalic.     Right Ear: Tympanic membrane, ear canal and external ear normal.     Left Ear: Tympanic membrane, ear canal and external ear normal.     Mouth/Throat:     Pharynx: Oropharynx is clear. No oropharyngeal exudate.  Eyes:     Pupils: Pupils are equal, round, and reactive to light.  Cardiovascular:     Rate and Rhythm: Normal rate and regular rhythm.     Heart sounds: Normal  heart sounds.  Pulmonary:     Effort: Pulmonary effort is normal.     Breath sounds: Normal breath sounds.  Abdominal:     General: Bowel sounds are normal.     Palpations: Abdomen is soft.  Musculoskeletal:        General: Normal range of motion.     Right lower leg: No edema.     Left lower leg: No edema.  Skin:    General: Skin is warm.  Neurological:     General: No focal deficit present.     Mental Status: She is alert and oriented to person, place, and time.  Psychiatric:        Mood and Affect: Mood normal.        Behavior: Behavior normal.      Assessment & Plan:    Kameela was seen today for wellness exam.  Diagnoses and all orders for this visit:  Participant in health and wellness plan Adult wellness physical was conducted today. Importance of diet and exercise were discussed in detail.  We reviewed immunizations and gave recommendations regarding current immunization needed for age.  Preventative health exams are up to date.   Patient was advised yearly wellness exam and follow-up with PCP as scheduled.      No orders of the defined types were placed in this encounter.   No orders of the defined types were placed in  this encounter.   Follow-up: as needed.
